# Patient Record
Sex: Male | Born: 1965 | Race: White | Hispanic: No | Marital: Single
Health system: Southern US, Community
[De-identification: ages and names within clinical notes are randomized; demographics above are authoritative.]

---

## 2005-09-25 ENCOUNTER — Ambulatory Visit: Payer: Self-pay | Admitting: Gastroenterology

## 2005-10-27 ENCOUNTER — Ambulatory Visit: Payer: Self-pay | Admitting: Gastroenterology

## 2005-11-25 ENCOUNTER — Ambulatory Visit (HOSPITAL_COMMUNITY): Admission: RE | Admit: 2005-11-25 | Discharge: 2005-11-25 | Payer: Self-pay | Admitting: General Surgery

## 2013-07-07 ENCOUNTER — Other Ambulatory Visit (HOSPITAL_COMMUNITY): Payer: Self-pay | Admitting: Family Medicine

## 2013-07-07 ENCOUNTER — Ambulatory Visit (HOSPITAL_COMMUNITY)
Admission: RE | Admit: 2013-07-07 | Discharge: 2013-07-07 | Disposition: A | Discharge status: Home / Self Care | Payer: Disability Insurance | Source: Ambulatory Visit | Attending: Family Medicine | Admitting: Family Medicine

## 2013-07-07 DIAGNOSIS — M79642 Pain in left hand: Secondary | ICD-10-CM

## 2013-07-07 DIAGNOSIS — M545 Low back pain, unspecified: Secondary | ICD-10-CM

## 2013-07-07 DIAGNOSIS — M79602 Pain in left arm: Secondary | ICD-10-CM

## 2013-07-07 DIAGNOSIS — M20009 Unspecified deformity of unspecified finger(s): Secondary | ICD-10-CM | POA: Insufficient documentation

## 2013-07-07 DIAGNOSIS — M79641 Pain in right hand: Secondary | ICD-10-CM

## 2013-07-07 DIAGNOSIS — M21939 Unspecified acquired deformity of unspecified forearm: Secondary | ICD-10-CM | POA: Insufficient documentation

## 2014-07-03 IMAGING — CR DG HAND COMPLETE 3+V*R*
3 series · 3 of 3 positions shown · non-contrast
Comparison: None.

CLINICAL DATA: Disability determination with pain in the forearm

EXAM:
RIGHT HAND - COMPLETE 3+ VIEW

[view not recorded (1 of 3)]
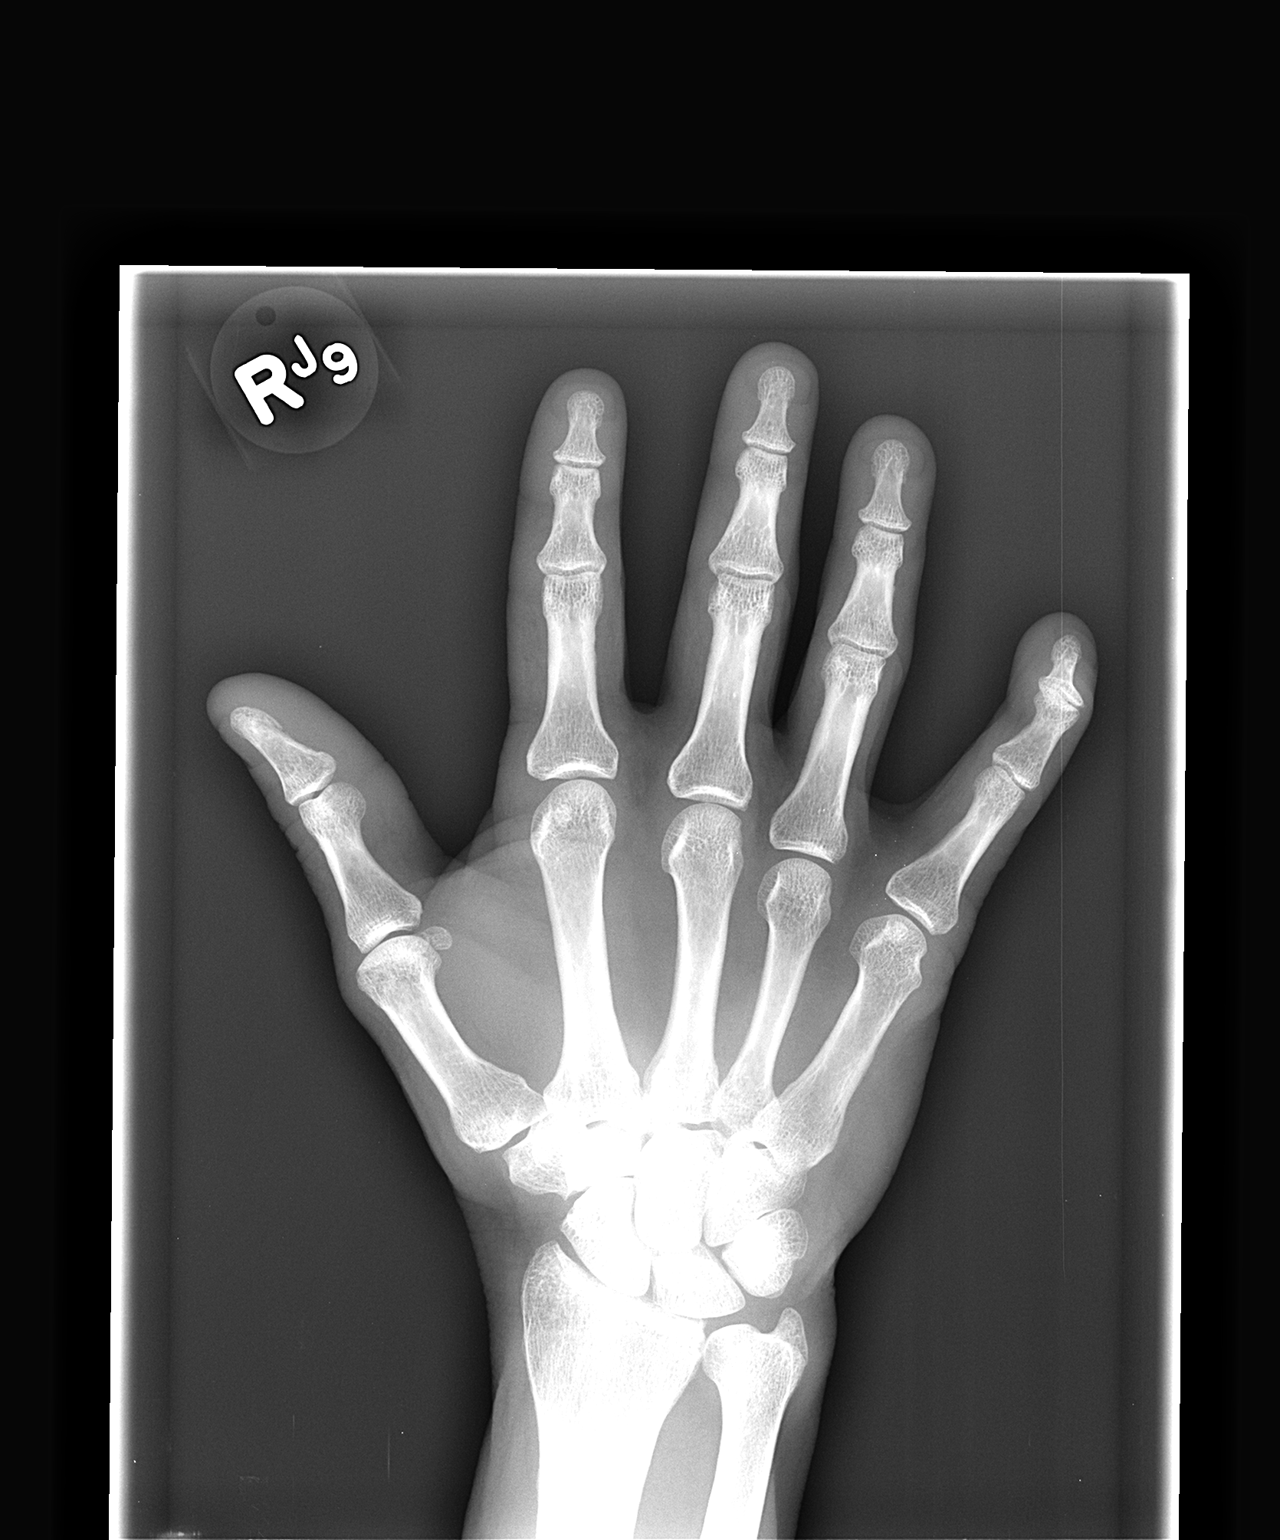

[view not recorded (2 of 3)]
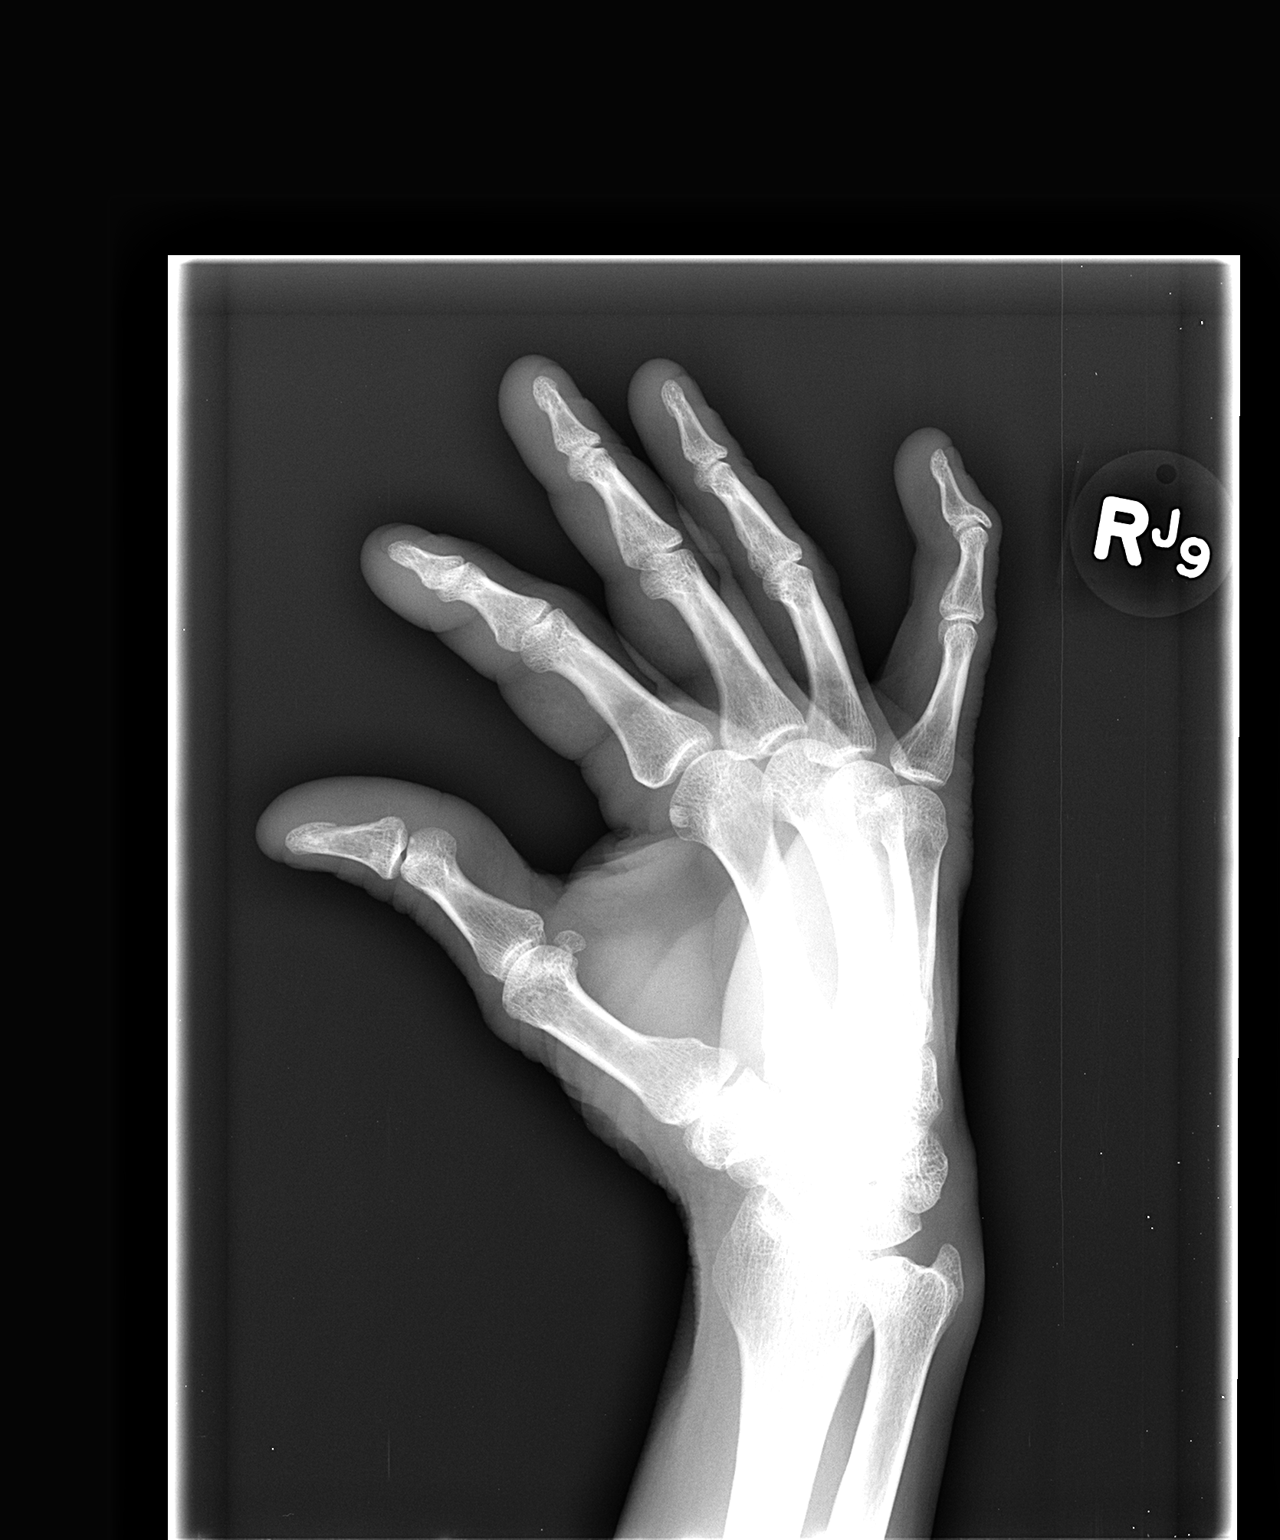

[view not recorded (3 of 3)]
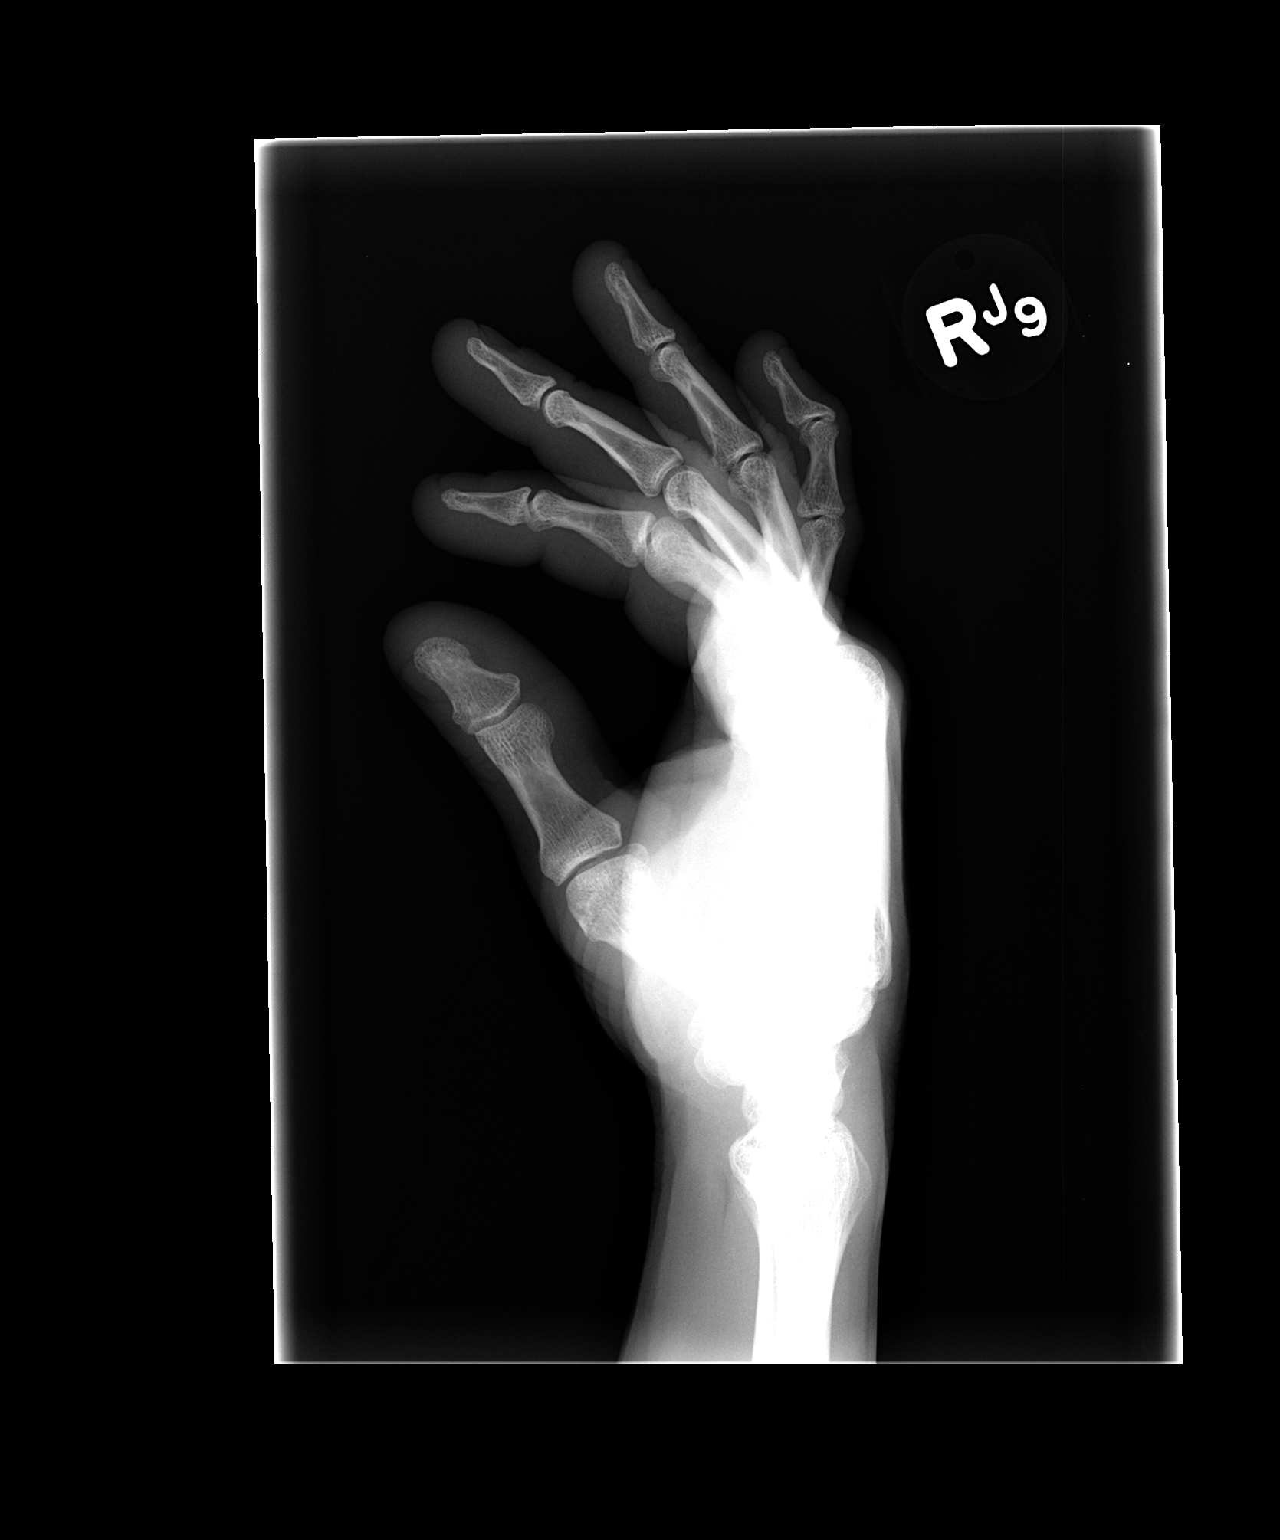

[3 of 3 positions shown; findings below may reference images not displayed]

FINDINGS: No fracture or dislocation. Mild osteophyte formation cyst distal
interphalangeal joint. This joint is not well evaluated due to
persistent mild flexion. No acute osseous abnormalities identified.
IMPRESSION: Mild deformity fifth distal interphalangeal joint with osteophyte
and persistent flexion. Otherwise negative study.

## 2014-07-03 IMAGING — CR DG HAND COMPLETE 3+V*L*
3 series · 3 of 3 positions shown · non-contrast
Comparison: None.

CLINICAL DATA: Pain.

EXAM:
LEFT HAND - COMPLETE 3+ VIEW

[view not recorded (1 of 3)]
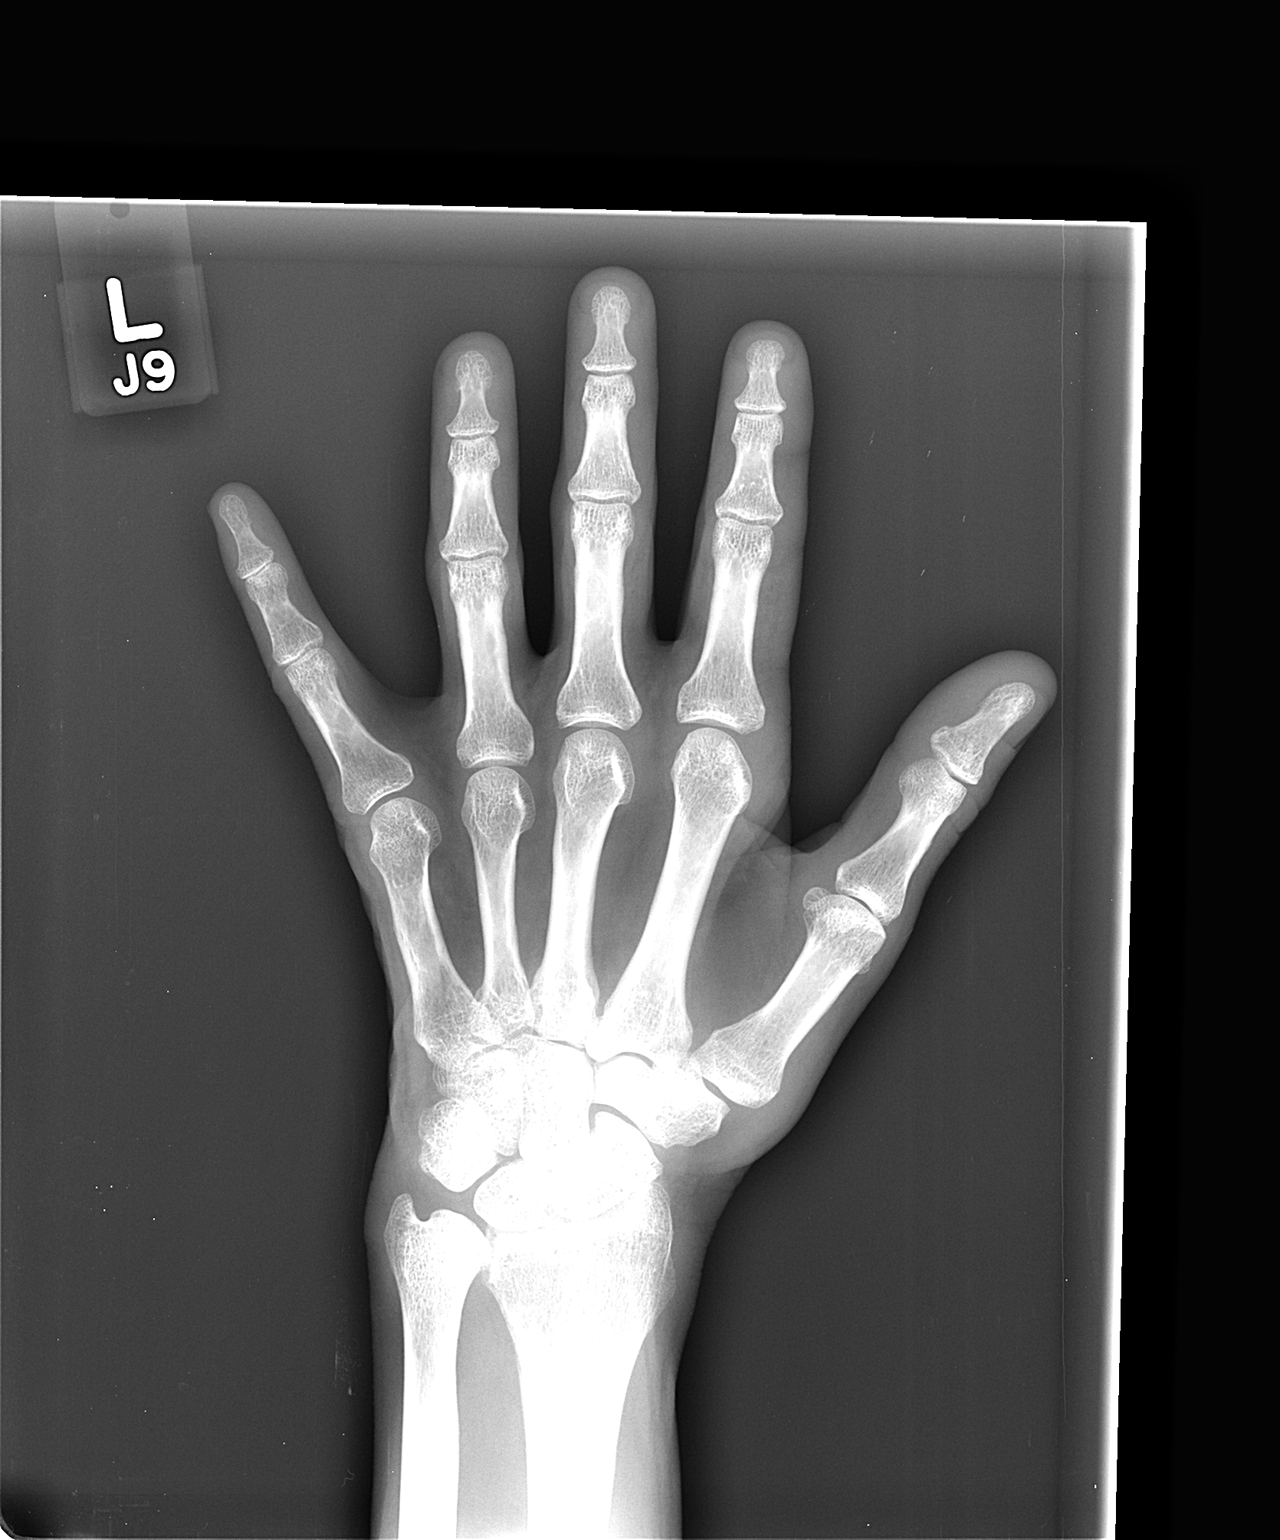

[view not recorded (2 of 3)]
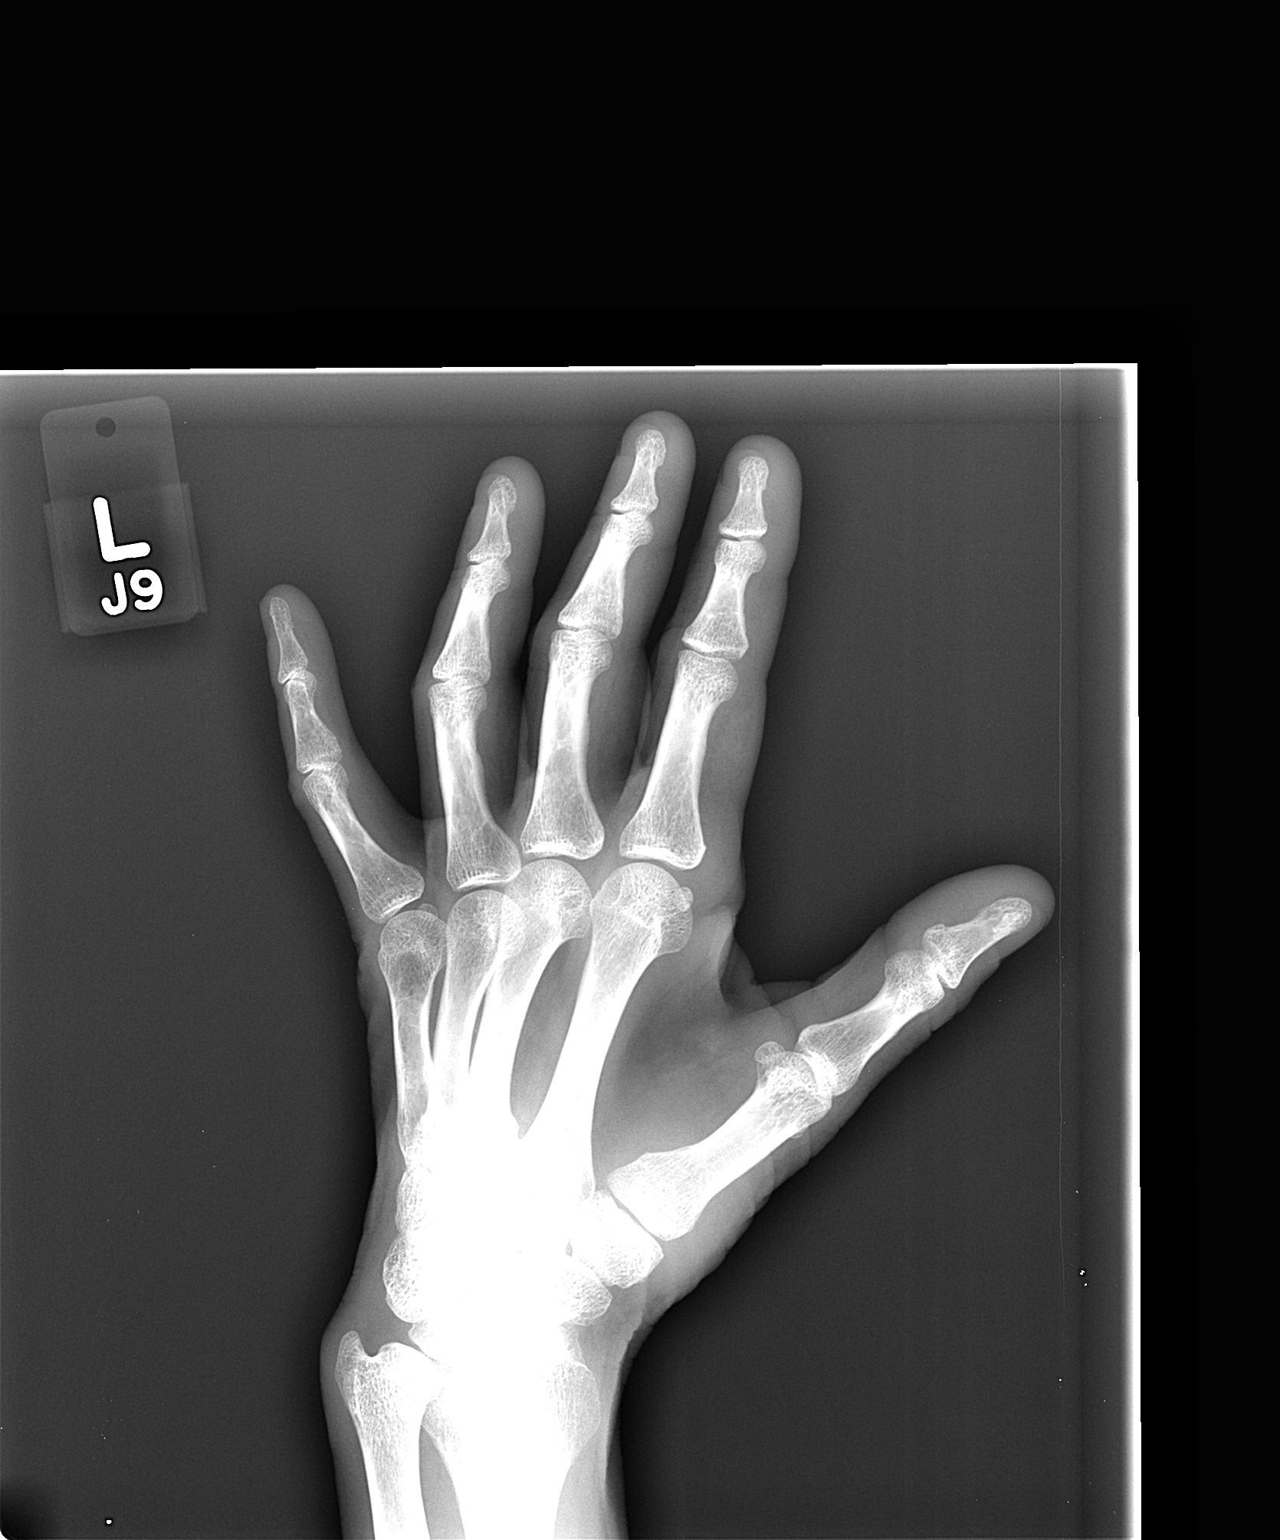

[view not recorded (3 of 3)]
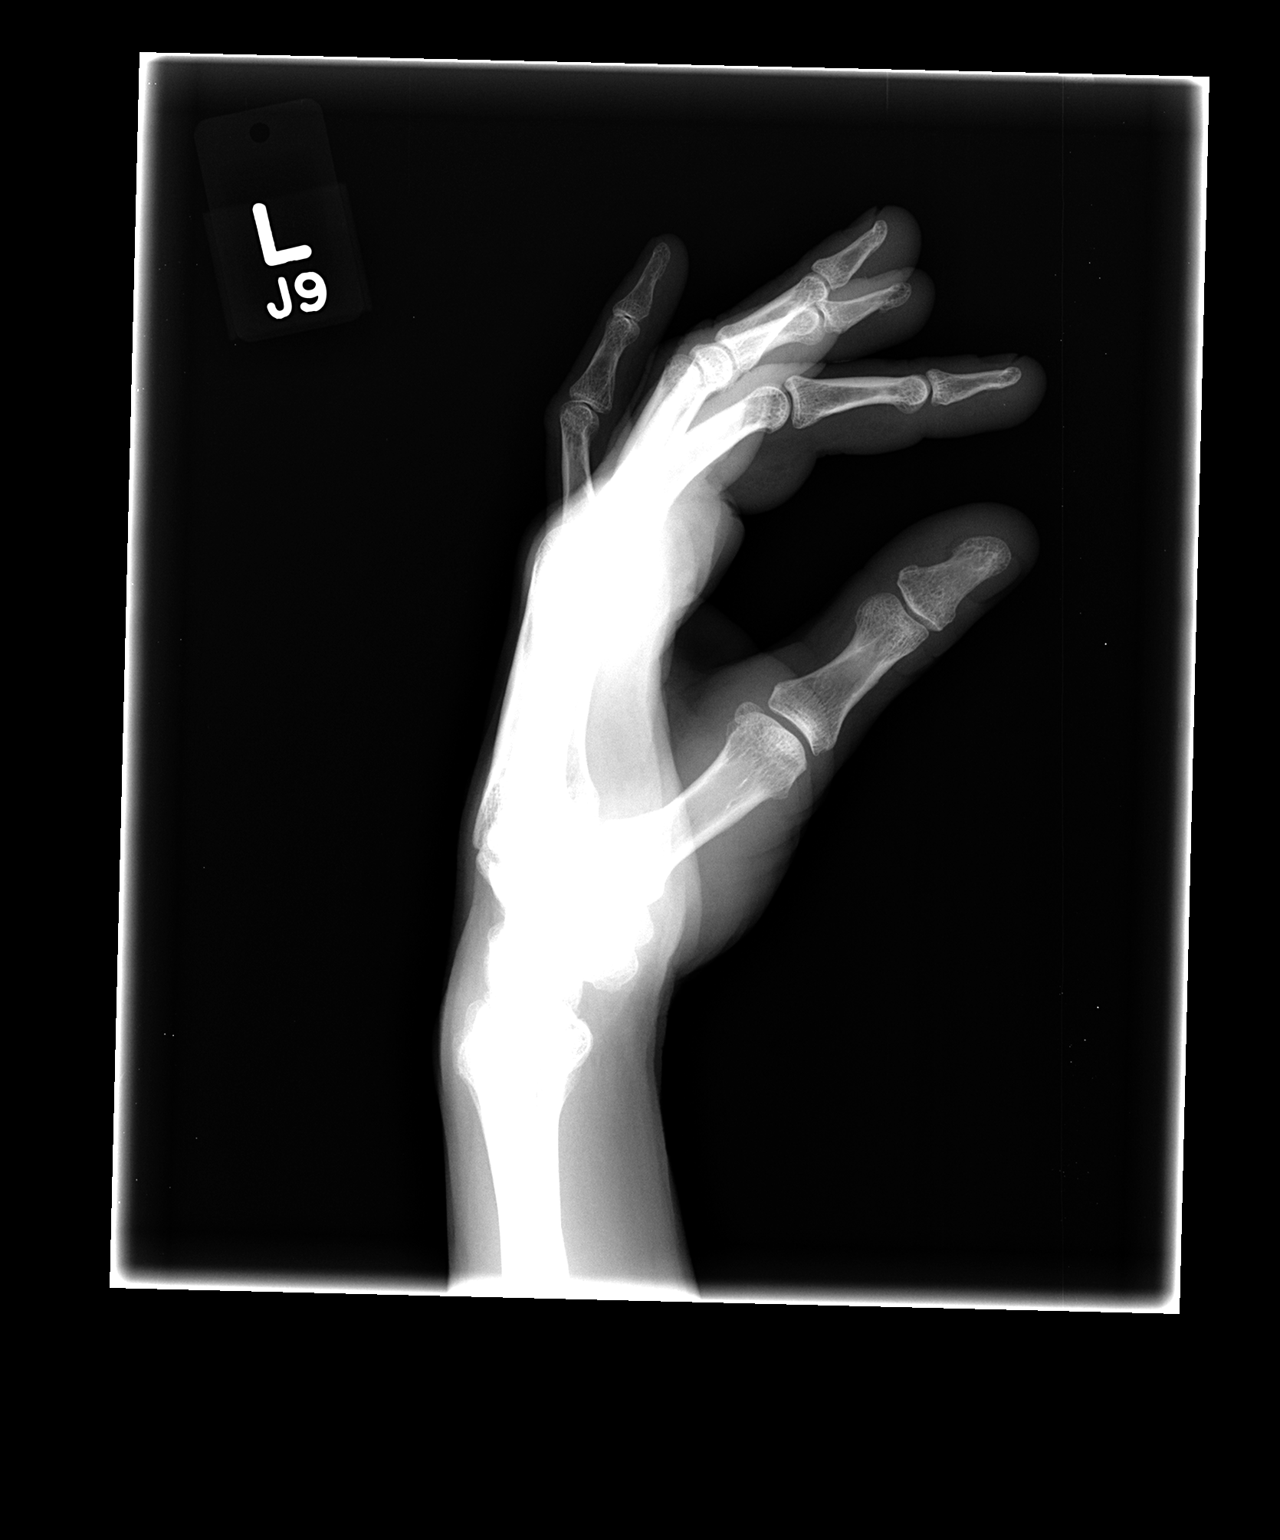

[3 of 3 positions shown; findings below may reference images not displayed]

FINDINGS: There is no evidence of fracture or dislocation. There is no
evidence of arthropathy or other focal bone abnormality. Soft
tissues are unremarkable.
IMPRESSION: Negative.
